# Patient Record
Sex: Male | Born: 1980 | Race: White | Hispanic: No | Marital: Married | Smoking: Former smoker
Health system: Southern US, Community
[De-identification: ages and names within clinical notes are randomized; demographics above are authoritative.]

## PROBLEM LIST (undated history)

## (undated) HISTORY — PX: NO PAST SURGERIES: SHX2092

---

## 2006-05-27 ENCOUNTER — Emergency Department: Payer: Self-pay | Admitting: Emergency Medicine

## 2015-06-20 ENCOUNTER — Ambulatory Visit
Admission: EM | Admit: 2015-06-20 | Discharge: 2015-06-20 | Disposition: A | Discharge status: Home / Self Care | Payer: No Typology Code available for payment source | Attending: Family Medicine | Admitting: Family Medicine

## 2015-06-20 DIAGNOSIS — J020 Streptococcal pharyngitis: Secondary | ICD-10-CM

## 2015-06-20 LAB — RAPID STREP SCREEN (MED CTR MEBANE ONLY): STREPTOCOCCUS, GROUP A SCREEN (DIRECT): POSITIVE — AB

## 2015-06-20 MED ORDER — IBUPROFEN 800 MG PO TABS
800.0000 mg | ORAL_TABLET | Freq: Once | ORAL | Status: AC
  Administered 2015-06-20: 800 mg via ORAL

## 2015-06-20 MED ORDER — AMOXICILLIN 875 MG PO TABS: 875.0000 mg | ORAL_TABLET | Freq: Two times a day (BID) | ORAL | Status: DC

## 2015-06-20 MED ORDER — IBUPROFEN 800 MG PO TABS: 800.0000 mg | ORAL_TABLET | Freq: Three times a day (TID) | ORAL | Status: DC | PRN

## 2015-06-20 MED ORDER — ACETAMINOPHEN 500 MG PO TABS
500.0000 mg | ORAL_TABLET | Freq: Once | ORAL | Status: AC
  Administered 2015-06-20: 1000 mg via ORAL

## 2015-06-20 MED ORDER — DEXAMETHASONE SODIUM PHOSPHATE 10 MG/ML IJ SOLN
10.0000 mg | Freq: Once | INTRAMUSCULAR | Status: AC
  Administered 2015-06-20: 10 mg via INTRAMUSCULAR

## 2015-06-20 NOTE — Discharge Instructions (Signed)
Take medication as prescribed. Rest. Drink plenty of fluids.   Follow up with your primary care physician this week as needed. Return to Urgent care or go to ER for difficulty swallowing, fever not responding to medication, inability to eat or drink, new or worsening concerns.   Strep Throat Strep throat is an infection of the throat caused by a bacteria named Streptococcus pyogenes. Your health care provider may call the infection streptococcal "tonsillitis" or "pharyngitis" depending on whether there are signs of inflammation in the tonsils or back of the throat. Strep throat is most common in children aged 5-15 years during the cold months of the year, but it can occur in people of any age during any season. This infection is spread from person to person (contagious) through coughing, sneezing, or other close contact. SIGNS AND SYMPTOMS   Fever or chills.  Painful, swollen, red tonsils or throat.  Pain or difficulty when swallowing.  White or yellow spots on the tonsils or throat.  Swollen, tender lymph nodes or "glands" of the neck or under the jaw.  Red rash all over the body (rare). DIAGNOSIS  Many different infections can cause the same symptoms. A test must be done to confirm the diagnosis so the right treatment can be given. A "rapid strep test" can help your health care provider make the diagnosis in a few minutes. If this test is not available, a light swab of the infected area can be used for a throat culture test. If a throat culture test is done, results are usually available in a day or two. TREATMENT  Strep throat is treated with antibiotic medicine. HOME CARE INSTRUCTIONS   Gargle with 1 tsp of salt in 1 cup of warm water, 3-4 times per day or as needed for comfort.  Family members who also have a sore throat or fever should be tested for strep throat and treated with antibiotics if they have the strep infection.  Make sure everyone in your household washes their hands  well.  Do not share food, drinking cups, or personal items that could cause the infection to spread to others.  You may need to eat a soft food diet until your sore throat gets better.  Drink enough water and fluids to keep your urine clear or pale yellow. This will help prevent dehydration.  Get plenty of rest.  Stay home from school, day care, or work until you have been on antibiotics for 24 hours.  Take medicines only as directed by your health care provider.  Take your antibiotic medicine as directed by your health care provider. Finish it even if you start to feel better. SEEK MEDICAL CARE IF:   The glands in your neck continue to enlarge.  You develop a rash, cough, or earache.  You cough up green, yellow-brown, or bloody sputum.  You have pain or discomfort not controlled by medicines.  Your problems seem to be getting worse rather than better.  You have a fever. SEEK IMMEDIATE MEDICAL CARE IF:   You develop any new symptoms such as vomiting, severe headache, stiff or painful neck, chest pain, shortness of breath, or trouble swallowing.  You develop severe throat pain, drooling, or changes in your voice.  You develop swelling of the neck, or the skin on the neck becomes red and tender.  You develop signs of dehydration, such as fatigue, dry mouth, and decreased urination.  You become increasingly sleepy, or you cannot wake up completely. MAKE SURE YOU:  Understand  these instructions.  Will watch your condition.  Will get help right away if you are not doing well or get worse. Document Released: 09/27/2000 Document Revised: 02/14/2014 Document Reviewed: 11/29/2010 [facility] Patient Information 2015 [place], [place]. This information is not intended to replace advice given to you by your health care provider. Make sure you discuss any questions you have with your health care provider.

## 2015-06-20 NOTE — ED Provider Notes (Signed)
[facility] Emergency Department Provider Note  ____________________________________________  Time seen: Approximately 3:58 PM  I have reviewed the triage vital signs and the nursing notes.   HISTORY  Chief Complaint Sore Throat; Fever; and Otalgia    HPI Edward Benitez is a 34 y.o. male presents for complaints of sore throat and fever since Sunday night. States intermittent left ear pain as well. States drinking fluids well but decreased appetite. States fever today was 105 orally. States last tylenol was 12 noon today. Denies nausea, vomiting, rash, abdominal pain, chest pain or shortness of breath. Reports continues to drink well. Denies cough or congestion.   States son goes to daycare and was told last week daycare has multiple cases of strep throat. States sore throat pain is 6/10. States hurts to swallow. Denies pain radiation. Denies other pain.    History reviewed. No pertinent past medical history.  There are no active problems to display for this patient.   History reviewed. No pertinent past surgical history.  Current Outpatient Rx  Name  Route  Sig  Dispense  Refill  . acetaminophen (TYLENOL) 325 MG tablet   Oral   Take 650 mg by mouth every 6 (six) hours as needed.           Allergies Review of patient's allergies indicates no known allergies.  History reviewed. No pertinent family history.  Social History Social History  Substance Use Topics  . Smoking status: Former Games developer  . Smokeless tobacco: None  . Alcohol Use: No     Review of Systems Constitutional: positive for fever.  Eyes: No visual changes. ENT: positive for sore throat. Cardiovascular: Denies chest pain. Respiratory: Denies shortness of breath. Gastrointestinal: No abdominal pain.  No nausea, no vomiting.  No diarrhea.  No constipation. Genitourinary: Negative for dysuria. Musculoskeletal: Negative for back pain. Skin: Negative for rash. Neurological:  Negative for headaches, focal weakness or numbness.  10-point ROS otherwise negative.  ____________________________________________   PHYSICAL EXAM:  VITAL SIGNS: ED Triage Vitals  Enc Vitals Group     BP 06/20/15 1521 130/90 mmHg     Pulse Rate 06/20/15 1521 102     Resp 06/20/15 1521 17     Temp 06/20/15 1521 100.4 F (38 C)     Temp Source 06/20/15 1521 Tympanic     SpO2 06/20/15 1521 100 %     Weight 06/20/15 1521 205 lb (92.987 kg)     Height 06/20/15 1521 6\' 1"  (1.854 m)     Head Cir --      Peak Flow --      Pain Score 06/20/15 1524 7     Pain Loc --      Pain Edu? --      Excl. in GC? --    Today's Vitals   06/20/15 1521 06/20/15 1524 06/20/15 1708 06/20/15 1758  BP: 130/90  123/84   Pulse: 102  114 104  Temp: 100.4 F (38 C)  102.1 F (38.9 C) 100.9 F (38.3 C)  TempSrc: Tympanic  Tympanic Tympanic  Resp: 17  17 16   Height: 6\' 1"  (1.854 m)     Weight: 205 lb (92.987 kg)     SpO2: 100%  99% 99%  PainSc:  7  8  9      Constitutional: Alert and oriented. Well appearing and in no acute distress. Eyes: Conjunctivae are normal. PERRL. EOMI. Head: Atraumatic.  Ears: no erythema, normal TMs bilaterally.   Nose: No congestion/rhinnorhea.  Mouth/Throat: Mucous  membranes are moist.  Pharynx mod erythema. Mild bilateral tonsillar swelling, no exudate. No uvular deviation or shift. No abscess visualized. No drooling. Drinking water in room.  Neck: No stridor.  No cervical spine tenderness to palpation. Hematological/Lymphatic/Immunilogical: mild anterior cervical lymphadenopathy. Cardiovascular: Normal rate, regular rhythm. Grossly normal heart sounds.  Good peripheral circulation. Respiratory: Normal respiratory effort.  No retractions. Lungs CTAB. Gastrointestinal: Soft and nontender. No distention. Normal Bowel sounds.  No abdominal bruits. No CVA tenderness. Musculoskeletal: No lower or upper extremity tenderness nor edema.   Neurologic:  Normal speech and  language. No gross focal neurologic deficits are appreciated. No gait instability. Skin:  Skin is warm, dry and intact. No rash noted. Psychiatric: Mood and affect are normal. Speech and behavior are normal.  ____________________________________________   LABS (all labs ordered are listed, but only abnormal results are displayed)  Labs Reviewed  RAPID STREP SCREEN (NOT AT [facility]) - Abnormal; Notable for the following:    Streptococcus, Group A Screen (Direct) POSITIVE (*)    All other components within normal limits   ______________________________________   INITIAL IMPRESSION / ASSESSMENT AND PLAN / ED COURSE  Pertinent labs & imaging results that were available during my care of the patient were reviewed by me and considered in my medical decision making (see chart for details).  Very well appearing. No acute distress. Presents for sore throat and fever x 2 days. Strep positive. Drinking fluids in room. Will treat with oral amoxicillin and ibuprofen. 10 mg decadron IM x one in urgent care.   Monitored in Urgent care as fever was 102.1. Post oral tylenol and ibuprofen, temperature 100.9 and patient reports feeling much better. Patient drinking fluids well in room. No acute distress.   Discussed strict follow up and return parameters with patient. Supportive treatments discussed including eating and drinking as well as fever control. Patient verbalized understanding and agreed to plan.  ____________________________________________   FINAL CLINICAL IMPRESSION(S) / ED DIAGNOSES  Final diagnoses:  Strep throat       Renford Dills, NP 06/20/15 1815

## 2015-06-20 NOTE — ED Notes (Signed)
Woke midnight Sunday/Monday with chills and dizziness. Temp was 100.8. States throat extremely sore when swallowing. Fever was 105. At noon today. Took Tylenol 975 mg at that time. Also c/o general body aches

## 2017-04-11 ENCOUNTER — Encounter: Payer: Self-pay | Admitting: Emergency Medicine

## 2017-04-11 ENCOUNTER — Emergency Department
Admission: EM | Admit: 2017-04-11 | Discharge: 2017-04-11 | Disposition: A | Discharge status: Home / Self Care | Payer: Worker's Compensation | Attending: Emergency Medicine | Admitting: Emergency Medicine

## 2017-04-11 ENCOUNTER — Emergency Department: Payer: Worker's Compensation

## 2017-04-11 DIAGNOSIS — Z23 Encounter for immunization: Secondary | ICD-10-CM | POA: Insufficient documentation

## 2017-04-11 DIAGNOSIS — Y990 Civilian activity done for income or pay: Secondary | ICD-10-CM | POA: Insufficient documentation

## 2017-04-11 DIAGNOSIS — M795 Residual foreign body in soft tissue: Secondary | ICD-10-CM

## 2017-04-11 DIAGNOSIS — Y929 Unspecified place or not applicable: Secondary | ICD-10-CM | POA: Diagnosis not present

## 2017-04-11 DIAGNOSIS — Z87891 Personal history of nicotine dependence: Secondary | ICD-10-CM | POA: Diagnosis not present

## 2017-04-11 DIAGNOSIS — S6992XA Unspecified injury of left wrist, hand and finger(s), initial encounter: Secondary | ICD-10-CM | POA: Diagnosis present

## 2017-04-11 DIAGNOSIS — Y939 Activity, unspecified: Secondary | ICD-10-CM | POA: Diagnosis not present

## 2017-04-11 DIAGNOSIS — X58XXXA Exposure to other specified factors, initial encounter: Secondary | ICD-10-CM | POA: Insufficient documentation

## 2017-04-11 MED ORDER — MORPHINE SULFATE (PF) 4 MG/ML IV SOLN
4.0000 mg | Freq: Once | INTRAVENOUS | Status: AC
  Administered 2017-04-11: 4 mg via INTRAVENOUS
  Filled 2017-04-11: qty 1

## 2017-04-11 MED ORDER — DEXTROSE 5 % IV SOLN
2.0000 mg/kg | Freq: Once | INTRAVENOUS | Status: AC
  Administered 2017-04-11: 190 mg via INTRAVENOUS
  Filled 2017-04-11: qty 4.75

## 2017-04-11 MED ORDER — OXYCODONE-ACETAMINOPHEN 5-325 MG PO TABS
1.0000 | ORAL_TABLET | Freq: Once | ORAL | Status: AC
Start: 2017-04-11 — End: 2017-04-11
  Administered 2017-04-11: 1 via ORAL
  Filled 2017-04-11: qty 1

## 2017-04-11 MED ORDER — OXYCODONE-ACETAMINOPHEN 5-325 MG PO TABS: 1.0000 | ORAL_TABLET | Freq: Four times a day (QID) | ORAL | 0 refills | Status: DC | PRN

## 2017-04-11 MED ORDER — CLINDAMYCIN HCL 150 MG PO CAPS: 450.0000 mg | ORAL_CAPSULE | Freq: Four times a day (QID) | ORAL | 0 refills | Status: AC

## 2017-04-11 MED ORDER — ONDANSETRON HCL 4 MG/2ML IJ SOLN
4.0000 mg | Freq: Once | INTRAMUSCULAR | Status: AC
  Administered 2017-04-11: 4 mg via INTRAVENOUS
  Filled 2017-04-11: qty 2

## 2017-04-11 MED ORDER — LIDOCAINE HCL (PF) 1 % IJ SOLN
10.0000 mL | Freq: Once | INTRAMUSCULAR | Status: AC
  Administered 2017-04-11: 10 mL via INTRADERMAL

## 2017-04-11 MED ORDER — TETANUS-DIPHTH-ACELL PERTUSSIS 5-2.5-18.5 LF-MCG/0.5 IM SUSP
0.5000 mL | Freq: Once | INTRAMUSCULAR | Status: AC
  Administered 2017-04-11: 0.5 mL via INTRAMUSCULAR
  Filled 2017-04-11: qty 0.5

## 2017-04-11 MED ORDER — LIDOCAINE HCL (PF) 1 % IJ SOLN
INTRAMUSCULAR | Status: AC
  Administered 2017-04-11: 10 mL via INTRADERMAL
  Filled 2017-04-11: qty 10

## 2017-04-11 MED ORDER — CLINDAMYCIN PHOSPHATE 600 MG/50ML IV SOLN
600.0000 mg | Freq: Once | INTRAVENOUS | Status: AC
  Administered 2017-04-11: 600 mg via INTRAVENOUS
  Filled 2017-04-11: qty 50

## 2017-04-11 NOTE — ED Triage Notes (Signed)
Pt comes from work with nail through left thumb and pallet board attached to nail. Pt is 10/10 pain and tetanus is not up todate.

## 2017-04-11 NOTE — ED Provider Notes (Signed)
[facility] Emergency Department Provider Note  ____________________________________________   First MD Initiated Contact with Patient 04/11/17 0701     (approximate)  I have reviewed the triage vital signs and the nursing notes.   HISTORY  Chief Complaint Foreign Body in Skin   HPI Edward GulaBrian Benitez is a 36 y.o. male without any chronic medical conditions was presenting after accidentally penetrated into nails through his left hand this morning. He says that he was removing the board when he accidentally impaled his hand on the 2 nails. He says there is no pain now after pain meds and also not moving the hand. He is not sure of his last tetanus shot.   History reviewed. No pertinent past medical history.  There are no active problems to display for this patient.   History reviewed. No pertinent surgical history.  Prior to Admission medications   Medication Sig Start Date End Date Taking? Authorizing Provider  acetaminophen (TYLENOL) 325 MG tablet Take 650 mg by mouth every 6 (six) hours as needed.    [provider]  amoxicillin (AMOXIL) 875 MG tablet Take 1 tablet (875 mg total) by mouth 2 (two) times daily. 06/20/15   Renford DillsMiller, Lindsey, NP  ibuprofen (ADVIL,MOTRIN) 800 MG tablet Take 1 tablet (800 mg total) by mouth every 8 (eight) hours as needed for mild pain or moderate pain. 06/20/15   Renford DillsMiller, Lindsey, NP    Allergies Patient has no known allergies.  No family history on file.  Social History Social History  Substance Use Topics  . Smoking status: Former Games developermoker  . Smokeless tobacco: Not on file  . Alcohol use No    Review of Systems  Constitutional: No fever/chills Eyes: No visual changes. ENT: No sore throat. Cardiovascular: Denies chest pain. Respiratory: Denies shortness of breath. Gastrointestinal: No abdominal pain.  No nausea, no vomiting.  No diarrhea.  No constipation. Genitourinary: Negative for  dysuria. Musculoskeletal: Negative for back pain. Skin: Negative for rash. Neurological: Negative for headaches, focal weakness or numbness.   ____________________________________________   PHYSICAL EXAM:  VITAL SIGNS: ED Triage Vitals [04/11/17 0644]  Enc Vitals Group     BP (!) 132/94     Pulse Rate 93     Resp 18     Temp 97.8 F (36.6 C)     Temp Source Oral     SpO2 97 %     Weight 210 lb (95.3 kg)     Height 6\' 1"  (1.854 m)     Head Circumference      Peak Flow      Pain Score 10     Pain Loc      Pain Edu?      Excl. in GC?     Constitutional: Alert and oriented.  in no acute distress.  Has about a 4 foot board that is wrapped to his left forearm to stabilize it as he has nails that have secured it to his left hand. Eyes: Conjunctivae are normal.  Head: Atraumatic. Nose: No congestion/rhinnorhea. Mouth/Throat: Mucous membranes are moist.  Neck: No stridor.   Cardiovascular: Normal rate, regular rhythm. Grossly normal heart sounds.   Respiratory: Normal respiratory effort.  No retractions. Lungs CTAB. Gastrointestinal: Soft and nontender. No distention.  Musculoskeletal: View of the nail insertion sites are partially obscured by the board overlying the dorsum of the left hand.  However, I'm able to visualize the insertion of the nail into the left dorsum of the thumb just proximal  to the interphalangeal joint. Unable to palpate the course of the nail under the skin to where it exits just abdominal folds. There is no active bleeding at this time. Very mild swelling. Patient is neurovascularly intact with brisk capillary refill to all of his fingers and able to range the fingers minimally except for the thumb which is secured by the nail. Neurologic:  Normal speech and language. No gross focal neurologic deficits are appreciated. Skin:  Skin is warm, dry and intact. No rash noted. Psychiatric: Mood and affect are normal. Speech and behavior are  normal.  ____________________________________________   LABS (all labs ordered are listed, but only abnormal results are displayed)  Labs Reviewed - No data to display ____________________________________________  EKG   ____________________________________________  RADIOLOGY  IMPRESSION: Two nails are noted in the left hand as described above . ____________________________________________   PROCEDURES  Procedure(s) performed:  NERVE BLOCK Performed by: Arelia Longest Consent: Verbal consent obtained. Required items: required blood products, implants, devices, and special equipment available Time out: Immediately prior to procedure a "time out" was called to verify the correct patient, procedure, equipment, support staff and site/side marked as required.  Indication: Foreign body removal  Nerve block body site: Left thumb   Preparation: Patient was prepped in the usual sterile fashion. Needle gauge: 25 G Location technique: anatomical landmarks  Local anesthetic: Lidocaine without epinephrine   Anesthetic total: 3 ml  Outcome: pain improved Patient tolerance: Patient tolerated the procedure well with no immediate complications.  Foreign body removal  After the patient was numbed and given morphine IV the gauze that was wrapped around the patient's forearms to the board was cut free and the board was held stable by assistant. Her then able to raise the board slightly off the forearms dislodge the nail in the hand without issue. Since the dorsal hand now is now very weak pulled gentle traction on the board while I held countertraction on the thumb and the nail was removed without issue. There is a minimal amount of bleeding afterwards and the patient remains neurovascularly intact. He tolerated the procedure well and has aching in his thumb after removal of the nail which she says is decreasing. On initial inspection of the nail it appeared to be dirty. However, after  having the nail fully exposed and it appears to be somewhat aged but without any appreciable amount of rust or dirt.   Procedures  Critical Care performed:   ____________________________________________   INITIAL IMPRESSION / ASSESSMENT AND PLAN / ED COURSE  Pertinent labs & imaging results that were available during my care of the patient were reviewed by me and considered in my medical decision making (see chart for details).  ----------------------------------------- 9:00 AM on 04/11/2017 -----------------------------------------   I discussed the case with Dr. Ernest Pine orthopedics. He agrees with removal of the nail in the emergency Department and recommends the patient follow up at the urgent care tomorrow for a wound check. I was concerned about contamination of the wound and that it may need a washout. The patient will be given gentamicin as well as clindamycin in the emergency department and discharged with clindamycin for home use. We will also be updating his tetanus shot.    ----------------------------------------- 9:54 AM on 04/11/2017 -----------------------------------------  Reassuring repeat x-ray. The patient is still having moderate pain especially when moving the finger. The wound was flushed with sterile saline. He is still neurovascularly intact and we placed in a straight splint and will follow-up with  orthopedics at the [facility] clinic tomorrow. He is understanding of this plan and willing to comply. Furthermore, after extraction of the nail thumb the dorsum of the hand was not penetrated by the nail at all but just abraded.  ____________________________________________   FINAL CLINICAL IMPRESSION(S) / ED DIAGNOSES  Foreign body to the left thumb.    NEW MEDICATIONS STARTED DURING THIS VISIT:  New Prescriptions   No medications on file     Note:  This document was prepared using Dragon voice recognition software and may include unintentional dictation  errors.     Myrna Blazer, MD 04/11/17 [id number]

## 2017-06-18 ENCOUNTER — Ambulatory Visit
Admission: EM | Admit: 2017-06-18 | Discharge: 2017-06-18 | Disposition: A | Discharge status: Home / Self Care | Payer: BLUE CROSS/BLUE SHIELD | Attending: Family Medicine | Admitting: Family Medicine

## 2017-06-18 DIAGNOSIS — H9312 Tinnitus, left ear: Secondary | ICD-10-CM

## 2017-06-18 DIAGNOSIS — H6592 Unspecified nonsuppurative otitis media, left ear: Secondary | ICD-10-CM

## 2017-06-18 DIAGNOSIS — H65192 Other acute nonsuppurative otitis media, left ear: Secondary | ICD-10-CM | POA: Diagnosis not present

## 2017-06-18 MED ORDER — AMOXICILLIN 875 MG PO TABS: 875.0000 mg | ORAL_TABLET | Freq: Two times a day (BID) | ORAL | 0 refills | Status: DC

## 2017-06-18 NOTE — ED Provider Notes (Signed)
[facility] ____________________________________________  Time seen: Approximately 9:08 AM  I have reviewed the triage vital signs and the nursing notes.   HISTORY  Chief Complaint Ear Fullness  HPI Edward Benitez is a 36 y.o. male  presenting for evaluation of left ear muffled sensation and ringing that is been present for the last 3-4 weeks. Patient reports last week he has had accompanying nasal congestion, nasal drainage and some sinus pressure that has overall improved, but still somewhat remains. Denies any fall, head injury, trauma. Denies any drainage or bleeding from ear. Denies any abrupt onset. Denies any facial or ear pain, paresthesias, decreased movement, neck discomfort, headache, chest pain, sore throat, fevers, frequent over-the-counter medication use, recent antibiotic use. Denies history of similar in the past. Denies recent swimming or recent exposure to loud noises. Patient reports otherwise feels well. Denies fevers. Patient states no aggravating or alleviating factors. Reports his wife has called and scheduled ENT appt for next Wednesday. Denies recent antibiotic use. Denies right ear complaints. Denies grinding teeth at night.   History reviewed. No pertinent past medical history. Denies   There are no active problems to display for this patient.   Past Surgical History:  Procedure Laterality Date  . NO PAST SURGERIES       No current facility-administered medications for this encounter.   Current Outpatient Prescriptions:  .  amoxicillin (AMOXIL) 875 MG tablet, Take 1 tablet (875 mg total) by mouth 2 (two) times daily., Disp: 20 tablet, Rfl: 0  Allergies Patient has no known allergies.  No family history on file.  Social History Social History  Substance Use Topics  . Smoking status: Former Games developer  . Smokeless tobacco: Never Used  . Alcohol use No    Review of Systems Constitutional: No fever/chills Eyes: No visual changes. ENT:  No sore throat. Cardiovascular: Denies chest pain. Respiratory: Denies shortness of breath. Gastrointestinal: No abdominal pain.  No nausea, no vomiting.   Genitourinary: Negative for dysuria. Musculoskeletal: Negative for back pain. Skin: Negative for rash. Neurological: Negative for headaches, focal weakness or numbness.   ____________________________________________   PHYSICAL EXAM:  VITAL SIGNS: ED Triage Vitals  Enc Vitals Group     BP 06/18/17 0830 (!) 139/98     Pulse Rate 06/18/17 0830 94     Resp 06/18/17 0830 18     Temp 06/18/17 0830 98.9 F (37.2 C)     Temp Source 06/18/17 0830 Oral     SpO2 06/18/17 0830 100 %     Weight 06/18/17 0827 220 lb (99.8 kg)     Height 06/18/17 0827 6\' 1"  (1.854 m)     Head Circumference --      Peak Flow --      Pain Score 06/18/17 0827 0     Pain Loc --      Pain Edu? --      Excl. in GC? --     Constitutional: Alert and oriented. Well appearing and in no acute distress. Eyes: Conjunctivae are normal. PERRL.  Head: Atraumatic. No sinus tenderness to palpation. No swelling. No erythema.No TMJ tenderness. No rash.  Ears: Right: Nontender, normal canal, no erythema, mild fluid present TM, otherwise normal TM. Left: Nontender, normal canal, mild erythema and fluid present TM, otherwise normal appearing TM. No surrounding tenderness, swelling or erythema bilaterally. No mastoid tenderness. Hearing grossly intact bilaterally.   Nose: Mild nasal congestion.  Mouth/Throat: Mucous membranes are moist. No pharyngeal erythema. No tonsillar swelling or exudate.  Neck:  No stridor.  No cervical spine tenderness to palpation. No carotid bruits noted.  Hematological/Lymphatic/Immunilogical: No cervical lymphadenopathy. Cardiovascular: Normal rate, regular rhythm. Grossly normal heart sounds.  Good peripheral circulation. Respiratory: Normal respiratory effort.  No retractions. No wheezes, rales or rhonchi. Good air movement.  Musculoskeletal:  Ambulatory with steady gait. No cervical, thoracic or lumbar tenderness to palpation. Neurologic:  Normal speech and language. No gait instability. Sensation intact bilateral face. Normal symmetrical facial movements.  Skin:  Skin appears warm, dry and intact. No rash noted. Psychiatric: Mood and affect are normal. Speech and behavior are normal.  ___________________________________________   LABS (all labs ordered are listed, but only abnormal results are displayed)  Labs Reviewed - No data to display ____________________________________________   PROCEDURES Procedures    INITIAL IMPRESSION / ASSESSMENT AND PLAN / ED COURSE  Pertinent labs & imaging results that were available during my care of the patient were reviewed by me and considered in my medical decision making (see chart for details).  Well-appearing patient. Left ear fullness sensation and tinnitus. Denies known trigger. Nose trauma. Patient with recent nasal congestion and effusion noted. Encouraged ENT evaluation, has an appointment this coming week. Will start patient on oral amoxicillin, over-the-counter Claritin. Encouraged rest, fluids, monitoring and follow-up.Discussed indication, risks and benefits of medications with patient. Discussed indication, risks and benefits of medications with patient.  Discussed follow up with Primary care physician this week. Discussed follow up and return parameters including no resolution or any worsening concerns. Patient verbalized understanding and agreed to plan.   ____________________________________________   FINAL CLINICAL IMPRESSION(S) / ED DIAGNOSES  Final diagnoses:  Tinnitus of left ear  Left otitis media with effusion     Discharge Medication List as of 06/18/2017  9:27 AM      Note: This dictation was prepared with Dragon dictation along with smaller phrase technology. Any transcriptional errors that result from this process are unintentional.           Renford DillsMiller, Tula Schryver, NP 06/18/17 1041

## 2017-06-18 NOTE — ED Triage Notes (Signed)
Patient complains of left ear fullness, ringing that started 3.5 weeks ago. Patient states that he has not had any pain.

## 2017-06-18 NOTE — Discharge Instructions (Signed)
Take medication as prescribed. Rest. Drink plenty of fluids.   Follow up with Ear Nose and Throat as scheduled.   Follow up with your primary care physician this week as needed. Return to Urgent care for new or worsening concerns.

## 2017-07-17 ENCOUNTER — Other Ambulatory Visit: Payer: Self-pay | Admitting: Otolaryngology

## 2017-07-17 DIAGNOSIS — H9042 Sensorineural hearing loss, unilateral, left ear, with unrestricted hearing on the contralateral side: Secondary | ICD-10-CM

## 2017-07-17 DIAGNOSIS — IMO0001 Reserved for inherently not codable concepts without codable children: Secondary | ICD-10-CM

## 2017-07-30 ENCOUNTER — Ambulatory Visit
Admission: RE | Admit: 2017-07-30 | Discharge: 2017-07-30 | Disposition: A | Discharge status: Home / Self Care | Payer: BLUE CROSS/BLUE SHIELD | Source: Ambulatory Visit | Attending: Otolaryngology | Admitting: Otolaryngology

## 2017-07-30 DIAGNOSIS — H9042 Sensorineural hearing loss, unilateral, left ear, with unrestricted hearing on the contralateral side: Secondary | ICD-10-CM | POA: Diagnosis not present

## 2017-07-30 DIAGNOSIS — IMO0001 Reserved for inherently not codable concepts without codable children: Secondary | ICD-10-CM

## 2017-07-30 MED ORDER — GADOBENATE DIMEGLUMINE 529 MG/ML IV SOLN
20.0000 mL | Freq: Once | INTRAVENOUS | Status: AC | PRN
  Administered 2017-07-30: 20 mL via INTRAVENOUS

## 2019-11-03 ENCOUNTER — Other Ambulatory Visit: Payer: Self-pay | Admitting: Orthopedic Surgery

## 2019-11-04 ENCOUNTER — Encounter
Admission: RE | Admit: 2019-11-04 | Discharge: 2019-11-04 | Disposition: A | Discharge status: Home / Self Care | Payer: BC Managed Care – PPO | Source: Ambulatory Visit | Attending: Orthopedic Surgery | Admitting: Orthopedic Surgery

## 2019-11-04 ENCOUNTER — Other Ambulatory Visit: Payer: Self-pay

## 2019-11-04 NOTE — Patient Instructions (Signed)
Your procedure is scheduled on: Tuesday November 09, 2019 Report to Day Surgery on the 2nd [facility]. To find out your arrival time, please call [phone] between 1PM - 3PM on: Monday November 08, 2019  REMEMBER: Instructions that are not followed completely may result in serious medical risk, up to and including death; or upon the discretion of your surgeon and anesthesiologist your surgery may need to be rescheduled.  Do not eat food after midnight the night before surgery.  No gum chewing, lozengers or hard candies.  You may however, drink CLEAR liquids up to 2 hours before you are scheduled to arrive for your surgery. Do not drink anything within 2 hours of the start of your surgery.  Clear liquids include: - water  - apple juice without pulp -clear  gatorade - black coffee or tea (Do NOT add milk or creamers to the coffee or tea) Do NOT drink anything that is not on this list.  Type 1 and Type 2 diabetics should only drink water.  ENSURE PRE-SURGERY CARBOHYDRATE DRINK:  Complete drinking 2 hours prior to arrival at hospital.  No Alcohol for 24 hours before or after surgery.  No Smoking including e-cigarettes for 24 hours prior to surgery.  No chewable tobacco products for at least 6 hours prior to surgery.  No nicotine patches on the day of surgery.  On the morning of surgery brush your teeth with toothpaste and water, you may rinse your mouth with mouthwash if you wish. Do not swallow any toothpaste or mouthwash.  Notify your doctor if there is any change in your medical condition (cold, fever, infection).  Do not wear jewelry, make-up, hairpins, clips or nail polish.  Do not wear lotions, powders, or perfumes  Or deodorant  Do not shave 48 hours prior to surgery.   Contacts and dentures may not be worn into surgery.  Do not bring valuables to the hospital, including drivers license, insurance or credit cards.  [facility] is not responsible for any  belongings or valuables.   TAKE THESE MEDICATIONS THE MORNING OF SURGERY: Skelaxin if needed  Use CHG Soap  as directed on instruction sheet.  Follow recommendations from Cardiologist, Pulmonologist or PCP regarding stopping Aspirin, Coumadin, Plavix, Eliquis, Pradaxa, or Pletal.  Stop Anti-inflammatories (NSAIDS) such as Advil, Aleve, Ibuprofen, Motrin, Naproxen, Naprosyn and Aspirin based products such as Excedrin, Goodys Powder, BC Powder. Meloxicam (May take Tylenol or Acetaminophen if needed.)  Stop ANY OVER THE COUNTER supplements until after surgery. (May continue Vitamin D, Vitamin B, and multivitamin.)  Wear comfortable clothing (specific to your surgery type) to the hospital.  Plan for stool softeners for home use.  If you are being discharged the day of surgery, you will not be allowed to drive home. You will need a responsible adult to drive you home and stay with you that night.   If you are taking public transportation, you will need to have a responsible adult with you. Please confirm with your physician that it is acceptable to use public transportation.   Please call [phone] if you have any questions about these instructions.

## 2019-11-05 ENCOUNTER — Other Ambulatory Visit: Payer: BC Managed Care – PPO

## 2019-11-05 ENCOUNTER — Encounter
Admission: RE | Admit: 2019-11-05 | Discharge: 2019-11-05 | Disposition: A | Discharge status: Home / Self Care | Payer: BC Managed Care – PPO | Source: Ambulatory Visit | Attending: Orthopedic Surgery | Admitting: Orthopedic Surgery

## 2019-11-05 ENCOUNTER — Other Ambulatory Visit
Admission: RE | Admit: 2019-11-05 | Discharge: 2019-11-05 | Disposition: A | Discharge status: Home / Self Care | Payer: BC Managed Care – PPO | Source: Ambulatory Visit | Attending: Orthopedic Surgery | Admitting: Orthopedic Surgery

## 2019-11-05 DIAGNOSIS — Z01812 Encounter for preprocedural laboratory examination: Secondary | ICD-10-CM | POA: Diagnosis present

## 2019-11-05 DIAGNOSIS — Z79899 Other long term (current) drug therapy: Secondary | ICD-10-CM | POA: Diagnosis not present

## 2019-11-05 DIAGNOSIS — M879 Osteonecrosis, unspecified: Secondary | ICD-10-CM | POA: Diagnosis present

## 2019-11-05 DIAGNOSIS — Z20822 Contact with and (suspected) exposure to covid-19: Secondary | ICD-10-CM | POA: Diagnosis not present

## 2019-11-05 LAB — CBC WITH DIFFERENTIAL/PLATELET
Abs Immature Granulocytes: 0.12 10*3/uL — ABNORMAL HIGH (ref 0.00–0.07)
Basophils Absolute: 0.1 10*3/uL (ref 0.0–0.1)
Basophils Relative: 1 %
Eosinophils Absolute: 0.2 10*3/uL (ref 0.0–0.5)
Eosinophils Relative: 3 %
HCT: 44 % (ref 39.0–52.0)
Hemoglobin: 15.2 g/dL (ref 13.0–17.0)
Immature Granulocytes: 1 %
Lymphocytes Relative: 37 %
Lymphs Abs: 3.3 10*3/uL (ref 0.7–4.0)
MCH: 30.1 pg (ref 26.0–34.0)
MCHC: 34.5 g/dL (ref 30.0–36.0)
MCV: 87.1 fL (ref 80.0–100.0)
Monocytes Absolute: 0.5 10*3/uL (ref 0.1–1.0)
Monocytes Relative: 6 %
Neutro Abs: 4.6 10*3/uL (ref 1.7–7.7)
Neutrophils Relative %: 52 %
Platelets: 220 10*3/uL (ref 150–400)
RBC: 5.05 MIL/uL (ref 4.22–5.81)
RDW: 13.7 % (ref 11.5–15.5)
WBC: 8.9 10*3/uL (ref 4.0–10.5)
nRBC: 0 % (ref 0.0–0.2)

## 2019-11-05 LAB — URINALYSIS, ROUTINE W REFLEX MICROSCOPIC
Bilirubin Urine: NEGATIVE
Glucose, UA: NEGATIVE mg/dL
Hgb urine dipstick: NEGATIVE
Ketones, ur: NEGATIVE mg/dL
Leukocytes,Ua: NEGATIVE
Nitrite: NEGATIVE
Protein, ur: NEGATIVE mg/dL
Specific Gravity, Urine: 1.012 (ref 1.005–1.030)
pH: 5 (ref 5.0–8.0)

## 2019-11-05 LAB — TYPE AND SCREEN
ABO/RH(D): O POS
Antibody Screen: NEGATIVE

## 2019-11-05 LAB — SURGICAL PCR SCREEN
MRSA, PCR: NEGATIVE
Staphylococcus aureus: NEGATIVE

## 2019-11-05 LAB — COMPREHENSIVE METABOLIC PANEL
ALT: 36 U/L (ref 0–44)
AST: 31 U/L (ref 15–41)
Albumin: 4.5 g/dL (ref 3.5–5.0)
Alkaline Phosphatase: 68 U/L (ref 38–126)
Anion gap: 10 (ref 5–15)
BUN: 17 mg/dL (ref 6–20)
CO2: 24 mmol/L (ref 22–32)
Calcium: 9.2 mg/dL (ref 8.9–10.3)
Chloride: 104 mmol/L (ref 98–111)
Creatinine, Ser: 1.35 mg/dL — ABNORMAL HIGH (ref 0.61–1.24)
GFR calc Af Amer: >60 mL/min (ref >60)
GFR calc non Af Amer: >60 mL/min (ref >60)
Glucose, Bld: 83 mg/dL (ref 70–99)
Potassium: 4.2 mmol/L (ref 3.5–5.1)
Sodium: 138 mmol/L (ref 135–145)
Total Bilirubin: 1 mg/dL (ref 0.3–1.2)
Total Protein: 8.2 g/dL — ABNORMAL HIGH (ref 6.5–8.1)

## 2019-11-05 LAB — APTT: aPTT: 26 seconds (ref 24–36)

## 2019-11-05 LAB — PROTIME-INR
INR: 1 (ref 0.8–1.2)
Prothrombin Time: 12.6 seconds (ref 11.4–15.2)

## 2019-11-05 LAB — SARS CORONAVIRUS 2 (TAT 6-24 HRS): SARS Coronavirus 2: NEGATIVE

## 2019-11-08 MED ORDER — CEFAZOLIN SODIUM-DEXTROSE 2-4 GM/100ML-% IV SOLN
2.0000 g | INTRAVENOUS | Status: AC
  Administered 2019-11-09: 2 g via INTRAVENOUS

## 2019-11-08 MED ORDER — TRANEXAMIC ACID-NACL 1000-0.7 MG/100ML-% IV SOLN
1000.0000 mg | INTRAVENOUS | Status: AC
  Administered 2019-11-09: 1000 mg via INTRAVENOUS

## 2019-11-09 ENCOUNTER — Encounter: Payer: Self-pay | Admitting: Orthopedic Surgery

## 2019-11-09 ENCOUNTER — Ambulatory Visit: Payer: BC Managed Care – PPO | Admitting: Anesthesiology

## 2019-11-09 ENCOUNTER — Ambulatory Visit: Payer: BC Managed Care – PPO

## 2019-11-09 ENCOUNTER — Ambulatory Visit
Admission: RE | Admit: 2019-11-09 | Discharge: 2019-11-09 | Disposition: A | Discharge status: Home / Self Care | Payer: BC Managed Care – PPO | Attending: Orthopedic Surgery | Admitting: Orthopedic Surgery

## 2019-11-09 ENCOUNTER — Other Ambulatory Visit: Payer: Self-pay

## 2019-11-09 ENCOUNTER — Encounter
Admission: RE | Disposition: A | Discharge status: Home / Self Care | Payer: Self-pay | Source: Home / Self Care | Attending: Orthopedic Surgery

## 2019-11-09 DIAGNOSIS — Z419 Encounter for procedure for purposes other than remedying health state, unspecified: Secondary | ICD-10-CM

## 2019-11-09 DIAGNOSIS — M879 Osteonecrosis, unspecified: Secondary | ICD-10-CM | POA: Insufficient documentation

## 2019-11-09 DIAGNOSIS — Z79899 Other long term (current) drug therapy: Secondary | ICD-10-CM | POA: Insufficient documentation

## 2019-11-09 DIAGNOSIS — G8918 Other acute postprocedural pain: Secondary | ICD-10-CM

## 2019-11-09 DIAGNOSIS — Z20822 Contact with and (suspected) exposure to covid-19: Secondary | ICD-10-CM | POA: Insufficient documentation

## 2019-11-09 HISTORY — PX: TOTAL HIP ARTHROPLASTY: SHX124

## 2019-11-09 LAB — CBC
HCT: 41.5 % (ref 39.0–52.0)
Hemoglobin: 14 g/dL (ref 13.0–17.0)
MCH: 30.1 pg (ref 26.0–34.0)
MCHC: 33.7 g/dL (ref 30.0–36.0)
MCV: 89.2 fL (ref 80.0–100.0)
Platelets: 181 10*3/uL (ref 150–400)
RBC: 4.65 MIL/uL (ref 4.22–5.81)
RDW: 13.6 % (ref 11.5–15.5)
WBC: 10.1 10*3/uL (ref 4.0–10.5)
nRBC: 0 % (ref 0.0–0.2)

## 2019-11-09 LAB — CREATININE, SERUM
Creatinine, Ser: 1.26 mg/dL — ABNORMAL HIGH (ref 0.61–1.24)
GFR calc Af Amer: >60 mL/min (ref >60)
GFR calc non Af Amer: >60 mL/min (ref >60)

## 2019-11-09 LAB — ABO/RH: ABO/RH(D): O POS

## 2019-11-09 SURGERY — ARTHROPLASTY, HIP, TOTAL, ANTERIOR APPROACH
Anesthesia: Spinal | Site: Hip | Laterality: Left

## 2019-11-09 MED ORDER — BUPIVACAINE-EPINEPHRINE 0.25% -1:200000 IJ SOLN
INTRAMUSCULAR | Status: DC | PRN
  Administered 2019-11-09: 30 mL

## 2019-11-09 MED ORDER — ENOXAPARIN SODIUM 40 MG/0.4ML ~~LOC~~ SOLN: 40.0000 mg | SUBCUTANEOUS | Status: DC

## 2019-11-09 MED ORDER — BUPIVACAINE HCL (PF) 0.5 % IJ SOLN
INTRAMUSCULAR | Status: DC | PRN
  Administered 2019-11-09: 2.5 mL

## 2019-11-09 MED ORDER — MIDAZOLAM HCL 5 MG/5ML IJ SOLN
INTRAMUSCULAR | Status: DC | PRN
  Administered 2019-11-09: 2 mg via INTRAVENOUS

## 2019-11-09 MED ORDER — METAXALONE 800 MG PO TABS
800.0000 mg | ORAL_TABLET | Freq: Three times a day (TID) | ORAL | Status: DC
  Administered 2019-11-09: 17:00:00 800 mg via ORAL
  Filled 2019-11-09: qty 1

## 2019-11-09 MED ORDER — ACETAMINOPHEN 500 MG PO TABS
ORAL_TABLET | ORAL | Status: AC
  Administered 2019-11-09: 14:00:00 1000 mg via ORAL
  Filled 2019-11-09: qty 2

## 2019-11-09 MED ORDER — SODIUM CHLORIDE 0.9 % IV SOLN
INTRAVENOUS | Status: DC | PRN
  Administered 2019-11-09: 60 mL

## 2019-11-09 MED ORDER — OXYCODONE HCL 5 MG PO TABS: 5.0000 mg | ORAL_TABLET | ORAL | 0 refills | Status: DC | PRN

## 2019-11-09 MED ORDER — FAMOTIDINE 20 MG PO TABS: 20.0000 mg | ORAL_TABLET | Freq: Once | ORAL | Status: AC

## 2019-11-09 MED ORDER — ONDANSETRON HCL 4 MG PO TABS: 4.0000 mg | ORAL_TABLET | Freq: Four times a day (QID) | ORAL | Status: DC | PRN

## 2019-11-09 MED ORDER — TRAMADOL HCL 50 MG PO TABS: 50.0000 mg | ORAL_TABLET | Freq: Four times a day (QID) | ORAL | Status: DC

## 2019-11-09 MED ORDER — OXYCODONE HCL 5 MG PO TABS
10.0000 mg | ORAL_TABLET | ORAL | Status: DC | PRN
  Filled 2019-11-09: qty 3

## 2019-11-09 MED ORDER — PROPOFOL 500 MG/50ML IV EMUL
INTRAVENOUS | Status: AC
  Filled 2019-11-09: qty 50

## 2019-11-09 MED ORDER — CEFAZOLIN SODIUM-DEXTROSE 2-4 GM/100ML-% IV SOLN
2.0000 g | Freq: Four times a day (QID) | INTRAVENOUS | Status: DC
  Administered 2019-11-09: 17:00:00 2 g via INTRAVENOUS

## 2019-11-09 MED ORDER — ACETAMINOPHEN 500 MG PO TABS: 1000.0000 mg | ORAL_TABLET | Freq: Four times a day (QID) | ORAL | Status: DC

## 2019-11-09 MED ORDER — ONDANSETRON HCL 4 MG/2ML IJ SOLN: 4.0000 mg | Freq: Four times a day (QID) | INTRAMUSCULAR | Status: DC | PRN

## 2019-11-09 MED ORDER — FAMOTIDINE 20 MG PO TABS
ORAL_TABLET | ORAL | Status: AC
  Administered 2019-11-09: 20 mg via ORAL
  Filled 2019-11-09: qty 1

## 2019-11-09 MED ORDER — OXYCODONE HCL 5 MG PO TABS
5.0000 mg | ORAL_TABLET | ORAL | Status: DC | PRN
  Filled 2019-11-09: qty 2

## 2019-11-09 MED ORDER — METOCLOPRAMIDE HCL 10 MG PO TABS: 5.0000 mg | ORAL_TABLET | Freq: Three times a day (TID) | ORAL | Status: DC | PRN

## 2019-11-09 MED ORDER — ENOXAPARIN SODIUM 40 MG/0.4ML ~~LOC~~ SOLN: 40.0000 mg | SUBCUTANEOUS | 0 refills | Status: DC

## 2019-11-09 MED ORDER — CEFAZOLIN SODIUM-DEXTROSE 2-4 GM/100ML-% IV SOLN
INTRAVENOUS | Status: AC
  Filled 2019-11-09: qty 100

## 2019-11-09 MED ORDER — OXYCODONE HCL 5 MG/5ML PO SOLN: 5.0000 mg | Freq: Once | ORAL | Status: AC | PRN

## 2019-11-09 MED ORDER — OXYCODONE HCL 5 MG PO TABS
5.0000 mg | ORAL_TABLET | Freq: Once | ORAL | Status: AC | PRN
  Administered 2019-11-09: 18:00:00 5 mg via ORAL

## 2019-11-09 MED ORDER — MENTHOL 3 MG MT LOZG
1.0000 | LOZENGE | OROMUCOSAL | Status: DC | PRN
  Filled 2019-11-09: qty 9

## 2019-11-09 MED ORDER — CHLORHEXIDINE GLUCONATE 4 % EX LIQD: 60.0000 mL | Freq: Once | CUTANEOUS | Status: DC

## 2019-11-09 MED ORDER — MIDAZOLAM HCL 2 MG/2ML IJ SOLN
INTRAMUSCULAR | Status: AC
  Filled 2019-11-09: qty 2

## 2019-11-09 MED ORDER — PHENOL 1.4 % MT LIQD
1.0000 | OROMUCOSAL | Status: DC | PRN
  Filled 2019-11-09: qty 177

## 2019-11-09 MED ORDER — OXYCODONE HCL 5 MG PO TABS
ORAL_TABLET | ORAL | Status: AC
  Filled 2019-11-09: qty 1

## 2019-11-09 MED ORDER — LACTATED RINGERS IV BOLUS
500.0000 mL | Freq: Once | INTRAVENOUS | Status: AC
  Administered 2019-11-09: 15:00:00 500 mL via INTRAVENOUS

## 2019-11-09 MED ORDER — NEOMYCIN-POLYMYXIN B GU 40-200000 IR SOLN
Status: DC | PRN
  Administered 2019-11-09: 2 mL

## 2019-11-09 MED ORDER — FENTANYL CITRATE (PF) 100 MCG/2ML IJ SOLN: 25.0000 ug | INTRAMUSCULAR | Status: DC | PRN

## 2019-11-09 MED ORDER — PROPOFOL 10 MG/ML IV BOLUS
INTRAVENOUS | Status: DC | PRN
  Administered 2019-11-09: 80 mg via INTRAVENOUS

## 2019-11-09 MED ORDER — DOCUSATE SODIUM 100 MG PO CAPS
100.0000 mg | ORAL_CAPSULE | Freq: Two times a day (BID) | ORAL | Status: DC
  Administered 2019-11-09: 17:00:00 100 mg via ORAL
  Filled 2019-11-09: qty 1

## 2019-11-09 MED ORDER — LACTATED RINGERS IV BOLUS
250.0000 mL | Freq: Once | INTRAVENOUS | Status: AC
  Administered 2019-11-09: 14:00:00 250 mL via INTRAVENOUS

## 2019-11-09 MED ORDER — METOCLOPRAMIDE HCL 5 MG/ML IJ SOLN: 5.0000 mg | Freq: Three times a day (TID) | INTRAMUSCULAR | Status: DC | PRN

## 2019-11-09 MED ORDER — ACETAMINOPHEN 325 MG PO TABS: 325.0000 mg | ORAL_TABLET | Freq: Four times a day (QID) | ORAL | Status: DC | PRN

## 2019-11-09 MED ORDER — HYDROMORPHONE HCL 1 MG/ML IJ SOLN: 0.5000 mg | INTRAMUSCULAR | Status: DC | PRN

## 2019-11-09 MED ORDER — TRANEXAMIC ACID-NACL 1000-0.7 MG/100ML-% IV SOLN
INTRAVENOUS | Status: AC
  Filled 2019-11-09: qty 100

## 2019-11-09 MED ORDER — PROPOFOL 500 MG/50ML IV EMUL
INTRAVENOUS | Status: DC | PRN
  Administered 2019-11-09: 150 ug/kg/min via INTRAVENOUS

## 2019-11-09 MED ORDER — TRAMADOL HCL 50 MG PO TABS: 50.0000 mg | ORAL_TABLET | Freq: Four times a day (QID) | ORAL | 1 refills | Status: DC

## 2019-11-09 MED ORDER — LACTATED RINGERS IV SOLN: INTRAVENOUS | Status: DC

## 2019-11-09 MED ORDER — TRAMADOL HCL 50 MG PO TABS
ORAL_TABLET | ORAL | Status: AC
  Administered 2019-11-09: 15:00:00 50 mg via ORAL
  Filled 2019-11-09: qty 1

## 2019-11-09 SURGICAL SUPPLY — 62 items
BLADE SAGITTAL AGGR TOOTH XLG (BLADE) ×3 IMPLANT
BNDG COHESIVE 6X5 TAN STRL LF (GAUZE/BANDAGES/DRESSINGS) ×9 IMPLANT
CANISTER SUCT 1200ML W/VALVE (MISCELLANEOUS) ×3 IMPLANT
CANISTER WOUND CARE 500ML ATS (WOUND CARE) ×3 IMPLANT
CHLORAPREP W/TINT 26 (MISCELLANEOUS) ×3 IMPLANT
COVER BACK TABLE REUSABLE LG (DRAPES) ×3 IMPLANT
COVER WAND RF STERILE (DRAPES) ×3 IMPLANT
DRAPE 3/4 80X56 (DRAPES) ×9 IMPLANT
DRAPE C-ARM XRAY 36X54 (DRAPES) ×3 IMPLANT
DRAPE INCISE IOBAN 66X60 STRL (DRAPES) IMPLANT
DRAPE POUCH INSTRU U-SHP 10X18 (DRAPES) ×3 IMPLANT
DRESSING SURGICEL FIBRLLR 1X2 (HEMOSTASIS) ×2 IMPLANT
DRSG OPSITE POSTOP 4X8 (GAUZE/BANDAGES/DRESSINGS) ×4 IMPLANT
DRSG SURGICEL FIBRILLAR 1X2 (HEMOSTASIS) ×6
ELECT BLADE 6.5 EXT (BLADE) ×3 IMPLANT
ELECT REM PT RETURN 9FT ADLT (ELECTROSURGICAL) ×3
ELECTRODE REM PT RTRN 9FT ADLT (ELECTROSURGICAL) ×1 IMPLANT
GLOVE BIOGEL PI IND STRL 9 (GLOVE) ×1 IMPLANT
GLOVE BIOGEL PI INDICATOR 9 (GLOVE) ×2
GLOVE SURG SYN 9.0  PF PI (GLOVE) ×4
GLOVE SURG SYN 9.0 PF PI (GLOVE) ×2 IMPLANT
GOWN SRG 2XL LVL 4 RGLN SLV (GOWNS) ×1 IMPLANT
GOWN STRL NON-REIN 2XL LVL4 (GOWNS) ×2
GOWN STRL REUS W/ TWL LRG LVL3 (GOWN DISPOSABLE) ×1 IMPLANT
GOWN STRL REUS W/TWL LRG LVL3 (GOWN DISPOSABLE) ×2
HEAD FEMORAL 28MM SZ S (Head) ×2 IMPLANT
HEMOVAC 400CC 10FR (MISCELLANEOUS) IMPLANT
HOLDER FOLEY CATH W/STRAP (MISCELLANEOUS) ×3 IMPLANT
HOOD PEEL AWAY FLYTE STAYCOOL (MISCELLANEOUS) ×3 IMPLANT
KIT PREVENA INCISION MGT 13 (CANNISTER) ×1 IMPLANT
LINER DML 28MM HIGHCROSS (Liner) ×2 IMPLANT
MAT ABSORB  FLUID 56X50 GRAY (MISCELLANEOUS) ×2
MAT ABSORB FLUID 56X50 GRAY (MISCELLANEOUS) ×1 IMPLANT
NDL SAFETY ECLIPSE 18X1.5 (NEEDLE) ×1 IMPLANT
NDL SPNL 20GX3.5 QUINCKE YW (NEEDLE) ×2 IMPLANT
NEEDLE HYPO 18GX1.5 SHARP (NEEDLE) ×2
NEEDLE SPNL 20GX3.5 QUINCKE YW (NEEDLE) ×6 IMPLANT
NS IRRIG 1000ML POUR BTL (IV SOLUTION) ×3 IMPLANT
PACK HIP COMPR (MISCELLANEOUS) ×3 IMPLANT
PENCIL SMOKE EVACUATOR COATED (MISCELLANEOUS) ×3 IMPLANT
SCALPEL PROTECTED #10 DISP (BLADE) ×6 IMPLANT
SHELL ACETABULAR DM  60MM (Shell) ×2 IMPLANT
SOL PREP PVP 2OZ (MISCELLANEOUS) ×3
SOLUTION PREP PVP 2OZ (MISCELLANEOUS) ×1 IMPLANT
SPONGE DRAIN TRACH 4X4 STRL 2S (GAUZE/BANDAGES/DRESSINGS) ×1 IMPLANT
STAPLER SKIN PROX 35W (STAPLE) ×3 IMPLANT
STEM FEMORAL SZ3  STD COLLARED (Stem) ×2 IMPLANT
STRAP SAFETY 5IN WIDE (MISCELLANEOUS) ×3 IMPLANT
SUT DVC 2 QUILL PDO  T11 36X36 (SUTURE) ×2
SUT DVC 2 QUILL PDO T11 36X36 (SUTURE) ×1 IMPLANT
SUT SILK 0 (SUTURE) ×2
SUT SILK 0 30XBRD TIE 6 (SUTURE) ×1 IMPLANT
SUT V-LOC 90 ABS DVC 3-0 CL (SUTURE) ×3 IMPLANT
SUT VIC AB 1 CT1 36 (SUTURE) ×3 IMPLANT
SYR 20ML LL LF (SYRINGE) ×3 IMPLANT
SYR 30ML LL (SYRINGE) ×3 IMPLANT
SYR 50ML LL SCALE MARK (SYRINGE) ×6 IMPLANT
SYR BULB IRRIG 60ML STRL (SYRINGE) ×3 IMPLANT
TAPE MICROFOAM 4IN (TAPE) ×1 IMPLANT
TOWEL OR 17X26 4PK STRL BLUE (TOWEL DISPOSABLE) ×3 IMPLANT
TRAY FOLEY MTR SLVR 16FR STAT (SET/KITS/TRAYS/PACK) ×3 IMPLANT
TUBE KAMVAC SUCTION (TUBING) ×2 IMPLANT

## 2019-11-09 NOTE — Discharge Instructions (Addendum)
Total Hip Replacement, Anterior, Care After This sheet gives you information about how to care for yourself after your procedure. Your health care provider may also give you more specific instructions. If you have problems or questions, contact your health care provider. What can I expect after the procedure? After the procedure, it is common to have:  Pain.  Stiffness.  Discomfort. Follow these instructions at home: Medicines  Take over-the-counter and prescription medicines only as told by your health care provider.  If you were prescribed a blood thinner (anticoagulant) to help prevent blood clots, take it as told by your health care provider. Incision care   Follow instructions from your health care provider about how to take care of your incision. Make sure you: ? Wash your hands with soap and water before you change your bandage (dressing). If soap and water are not available, use hand sanitizer. ? Change your dressing as told by your health care provider. ? Leave stitches (sutures), skin glue, or adhesive strips in place. These skin closures may need to stay in place for 2 weeks or longer. If adhesive strip edges start to loosen and curl up, you may trim the loose edges. Do not remove adhesive strips completely unless your health care provider tells you to do that.  Check your incision area every day for signs of infection. Check for: ? Redness, swelling, or pain. ? Fluid or blood. ? Warmth. ? Pus or a bad smell. Bathing  Do not take baths, swim, or use a hot tub until your health care provider approves. Ask your health care provider if you may take showers. You may only be allowed to take sponge baths.  Keep the dressing dry until your health care provider says it can be removed. Managing pain, stiffness, and swelling   If directed, put ice on the affected area. ? Put ice in a plastic bag. ? Place a towel between your skin and the bag. ? Leave the ice on for 20  minutes, 2-3 times a day.  Move your toes often to avoid stiffness and to lessen swelling.  Raise (elevate) your leg above the level of your heart while you are sitting or lying down. Activity  Rest as told by your health care provider.  Avoid sitting for a long time without moving. Get up to take short walks every 1-2 hours. This is important to improve blood flow and breathing. Ask for help if you feel weak or unsteady.  Do exercises as told by your health care provider or physical therapist.  Follow instructions from your health care provider about using a Bricco, crutches, or a cane. ? You may use your legs to support (bear) your body weight as told by your health care provider. Follow instructions about how much weight you may safely support on your affected leg (weight-bearing restrictions). ? A physical therapist may show you how to get out of a bed and chair and how to go up and down stairs. You will first do this with a Axelrod, crutches, or a cane and then without any of these devices. ? Once you are able to walk without a limp, you may stop using a Morris, crutches, or cane.  Return to your normal activities as told by your health care provider. Ask your health care provider what activities are safe for you. Safety  To help prevent falls, keep floors clear of objects you may trip over, and place items that you may need within easy reach.  Wear  an apron or tool belt with pockets for carrying objects. This leaves your hands free to help with your balance. Driving  Do not drive or use heavy machinery while taking prescription pain medicine.  Ask your health care provider when it is safe to drive. General instructions  Wear compression stockings as told by your health care provider. These stockings help to prevent blood clots and reduce swelling in your legs.  Continue with breathing exercises as directed by your health care provider. This helps prevent lung infection.  If  you are taking prescription pain medicine, take actions to prevent or treat constipation. Your health care provider may recommend that you: ? Drink enough fluid to keep your urine pale yellow. ? Eat foods that are high in fiber, such as fresh fruits and vegetables, whole grains, and beans. ? Limit foods that are high in fat and processed sugars, such as fried or sweet foods. ? Take an over-the-counter or prescription medicine for constipation.  Do not use any products that contain nicotine or tobacco, such as cigarettes and e-cigarettes. These can delay bone healing. If you need help quitting, ask your health care provider.  Tell your health care provider if you plan to have dental work. Also: ? Tell your dentist about your joint replacement. ? Ask your health care provider if there are any special instructions you need to follow before having dental care and routine cleanings.  Keep all follow-up visits as told by your health care provider. This is important. Contact a health care provider if:  You have a fever or chills.  You have a cough or feel short of breath.  Your medicine is not controlling your pain.  You have redness, swelling, or pain around your incision.  You have fluid or blood coming from your incision.  Your incision feels warm to the touch.  You have pus or a bad smell coming from your incision. Get help right away if you have:  Severe pain.  Trouble breathing.  Chest pain.  Redness, swelling, pain, and warmth in your calf or leg. Summary  Follow instructions from your health care provider about how to take care of your incision.  Do not take baths, swim, or use a hot tub until your health care provider approves.  Use crutches, a walker, or a cane as told by your health care provider.  If you were prescribed a blood thinner (anticoagulant) to help prevent blood clots, take it as told by your health care provider. This information is not intended to  replace advice given to you by your health care provider. Make sure you discuss any questions you have with your health care provider. Document Revised: 02/08/2019 Document Reviewed: 01/14/2018 Elsevier Patient Education  2020 [employer]   1) The drugs that you were given will stay in your system until tomorrow so for the next 24 hours you should not:  A) Drive an automobile B) Make any legal decisions C) Drink any alcoholic beverage   2) You may resume regular meals tomorrow.  Today it is better to start with liquids and gradually work up to solid foods.  You may eat anything you prefer, but it is better to start with liquids, then soup and crackers, and gradually work up to solid foods.   3) Please notify your doctor immediately if you have any unusual bleeding, trouble breathing, redness and pain at the surgery site, drainage, fever, or pain not relieved by medication.  4) Additional Instructions: TAKE A STOOL SOFTENER TWICE A DAY WHILE TAKING NARCOTIC PAIN MEDICINE TO PREVENT CONSTIPATION   Please contact your physician with any problems or Same Day Surgery at [phone], Monday through Friday 6 am to 4 pm, or [place] at [facility] number at [phone].

## 2019-11-09 NOTE — Op Note (Signed)
11/09/2019  1:14 PM  PATIENT:  Edward Benitez  39 y.o. male  PRE-OPERATIVE DIAGNOSIS:  AVASCULAR NECROSIS OF LEFT HIP  POST-OPERATIVE DIAGNOSIS:  AVASCULAR NECROSIS OF LEFT HIP  PROCEDURE:  Procedure(s): LEFT TOTAL HIP ARTHROPLASTY ANTERIOR APPROACH (Left)  SURGEON: Leitha Schuller, MD  ASSISTANTS: None  ANESTHESIA:   spinal  EBL:  Total I/O In: -  Out: 400 [Urine:200; Blood:200]  BLOOD ADMINISTERED:none  DRAINS: none   LOCAL MEDICATIONS USED:  MARCAINE    and OTHER Exparel  SPECIMEN:  Source of Specimen:  Left femoral head  DISPOSITION OF SPECIMEN:  PATHOLOGY  COUNTS:  YES  TOURNIQUET:  * No tourniquets in log *  IMPLANTS: Medacta AMIS standard size 3 with 60 mm Mpact TM cup and liner with ceramic S 28 mm head  DICTATION: .Dragon Dictation   The patient was brought to the operating room and after spinal anesthesia was obtained patient was placed on the operative table with the ipsilateral foot into the Medacta attachment, contralateral leg on a well-padded table. C-arm was brought in and preop template x-ray taken. After prepping and draping in usual sterile fashion appropriate patient identification and timeout procedures were completed. Anterior approach to the hip was obtained and centered over the greater trochanter and TFL muscle. The subcutaneous tissue was incised hemostasis being achieved by electrocautery. TFL fascia was incised and the muscle retracted laterally deep retractor placed. The lateral femoral circumflex vessels were identified and ligated. The anterior capsule was exposed and a capsulotomy performed. The neck was identified and a femoral neck cut carried out with a saw. The head was removed without difficulty and showed collapse of the femoral head with breakage of the articular cartilage extensive synovitis and synovitis in the acetabular notch as well. Reaming was carried out to 60 mm and a 60 mm cup trial gave appropriate tightness to the acetabular  component a 60 mm DM cup was impacted into position. The leg was then externally rotated and ischiofemoral and pubofemoral releases carried out. The femur was sequentially broached to a size 3 standard, size 3 standard with S head trials were placed and the final components chosen. The 3 standard stem was inserted along with a ceramic S 28 mm head and 60 mm liner. The hip was reduced and was stable the wound was thoroughly irrigated with fibrillar placed along the posterior capsule and medial neck. The deep fascia ws closed using a heavy Quill after infiltration of 30 cc of quarter percent Sensorcaine with epinephrine, with Exparel in addition to the Sensorcaine this was injected throughout the case for postop analgesia .3-0 V-loc to close the skin with skin staples.  Honeycomb dressing applied patient was sent to recovery in stable condition.   PLAN OF CARE: Discharge to home after PACU

## 2019-11-09 NOTE — Transfer of Care (Signed)
Immediate Anesthesia Transfer of Care Note  Patient: Edward Benitez  Procedure(s) Performed: LEFT TOTAL HIP ARTHROPLASTY ANTERIOR APPROACH (Left Hip)  Patient Location: PACU  Anesthesia Type:Spinal  Level of Consciousness: sedated  Airway & Oxygen Therapy: Patient Spontanous Breathing and Patient connected to face mask oxygen  Post-op Assessment: Report given to RN and Post -op Vital signs reviewed and stable  Post vital signs: Reviewed and stable  Last Vitals:  Vitals Value Taken Time  BP 104/45 11/09/19 1313  Temp    Pulse 64 11/09/19 1315  Resp 15 11/09/19 1315  SpO2 100 % 11/09/19 1315  Vitals shown include unvalidated device data.  Last Pain:  Vitals:   11/09/19 1037  TempSrc: Temporal  PainSc: 8       Patients Stated Pain Goal: 3 (11/09/19 1037)  Complications: No apparent anesthesia complications

## 2019-11-09 NOTE — OR Nursing (Signed)
PT in for eval 1640.

## 2019-11-09 NOTE — Anesthesia Preprocedure Evaluation (Signed)
Anesthesia Evaluation  Patient identified by MRN, date of birth, ID band Patient awake    Reviewed: Allergy & Precautions, H&P , NPO status , Patient's Chart, lab work & pertinent test results  History of Anesthesia Complications Negative for: history of anesthetic complications  Airway Mallampati: III  TM Distance: <3 FB Neck ROM: full    Dental  (+) Chipped   Pulmonary neg shortness of breath, former smoker,  Signs and symptoms suggestive of sleep apnea            Cardiovascular (-) angina(-) Past MI and (-) DOE negative cardio ROS       Neuro/Psych negative neurological ROS  negative psych ROS   GI/Hepatic negative GI ROS, Neg liver ROS, neg GERD  ,  Endo/Other  negative endocrine ROS  Renal/GU      Musculoskeletal   Abdominal   Peds  Hematology negative hematology ROS (+)   Anesthesia Other Findings  Past Surgical History: No date: NO PAST SURGERIES     Reproductive/Obstetrics negative OB ROS                             Anesthesia Physical Anesthesia Plan  ASA: II  Anesthesia Plan: Spinal   Post-op Pain Management:    Induction:   PONV Risk Score and Plan:   Airway Management Planned: Natural Airway and Nasal Cannula  Additional Equipment:   Intra-op Plan:   Post-operative Plan:   Informed Consent: I have reviewed the patients History and Physical, chart, labs and discussed the procedure including the risks, benefits and alternatives for the proposed anesthesia with the patient or authorized representative who has indicated his/her understanding and acceptance.     Dental Advisory Given  Plan Discussed with: Anesthesiologist, CRNA and Surgeon  Anesthesia Plan Comments: (Patient reports no bleeding problems and no anticoagulant use.  Plan for spinal with backup GA  Patient consented for risks of anesthesia including but not limited to:  - adverse  reactions to medications - risk of bleeding, infection, nerve damage and headache - risk of failed spinal - damage to teeth, lips or other oral mucosa - sore throat or hoarseness - Damage to heart, brain, lungs or loss of life  Patient voiced understanding.)        Anesthesia Quick Evaluation

## 2019-11-09 NOTE — Evaluation (Signed)
Physical Therapy Evaluation Patient Details Name: Edward Benitez MRN: [id number] DOB: 12/05/1980 Today's Date: 11/09/2019   History of Present Illness  39 y/o male with L hip avascular necrosis, s/p L total hip (anterior approach) replacement.  Clinical Impression  Pt with slow recovery of sensation in L LE, but does have active ROM and good quad control/strength with testing.  However, in getting to standing he had buckling in the R hip (Trendelenberg sign-like) but he was able to actively correct for this with deliberate muscle engagement and using walker was able to walk >100 ft and negotiate up/down steps.  Pt did well with exercises, showed good understanding of precautions and will be able to return home safely with assist from family. Pt did have an issue with nausea after doing steps with drop in HR, vitals quickly stable, nursing with pt at end of session.     Follow Up Recommendations Home health PT;Follow surgeon's recommendation for DC plan and follow-up therapies    Equipment Recommendations  None recommended by PT(had father-in-laws walker)    Recommendations for Other Services       Precautions / Restrictions Precautions Precautions: Anterior Hip Precaution Booklet Issued: Yes (comment) Restrictions Weight Bearing Restrictions: Yes LLE Weight Bearing: Weight bearing as tolerated      Mobility  Bed Mobility Overal bed mobility: Independent             General bed mobility comments: able to transition to sitting w/o assist, minimal cuing  Transfers Overall transfer level: Modified independent Equipment used: Rolling walker (2 wheeled)             General transfer comment: Pt struggled much more than expected with L hip/knee near buckling in getting to standing, very reliant on UEs  Ambulation/Gait Ambulation/Gait assistance: Min guard Gait Distance (Feet): 125 Feet Assistive device: Rolling walker (2 wheeled)       General Gait Details: Pt with  multiple bouts of L hip Abd buckling and initially needed to be very slow and extremely deliberate with each steps and initially was not overly safe.  He did improve with increased time and focus.  by the end he was able to maintain slow but continuous walker forward motion with no L LE buckling  Stairs Stairs: Yes Stairs assistance: Min guard Stair Management: Two rails Number of Stairs: 4 General stair comments: With light cuing and heavy UE reliance pt was able to ascend/descend steps  Wheelchair Mobility    Modified Rankin (Stroke Patients Only)       Balance Overall balance assessment: Needs assistance Sitting-balance support: No upper extremity supported Sitting balance-Leahy Scale: Good     Standing balance support: Bilateral upper extremity supported Standing balance-Leahy Scale: Fair Standing balance comment: Pt with multiple bouts of buckling in L hip, highly reliant on walker but good UE strength allowed him to arrest each time                             Pertinent Vitals/Pain Pain Assessment: 0-10 Pain Score: 4  Pain Location: ant hip    Home Living Family/patient expects to be discharged to:: Private residence Living Arrangements: Spouse/significant other;Children Available Help at Discharge: Family(wife home all day tomorrow, 40 y/o child will be around ) Type of Home: House Home Access: Stairs to enter Entrance Stairs-Rails: Can reach both Entrance Stairs-Number of Steps: 2 Home Layout: One level Home Equipment: Walker - 2 wheels;Crutches  Prior Function Level of Independence: Independent         Comments: Independent and active, works as Psychologist, counselling Extremity Assessment Upper Extremity Assessment: Overall WFL for tasks assessed    Lower Extremity Assessment Lower Extremity Assessment: (expected L LE post-op weakness)       Communication   Communication: No  difficulties  Cognition Arousal/Alertness: Awake/alert Behavior During Therapy: WFL for tasks assessed/performed Overall Cognitive Status: Within Functional Limits for tasks assessed                                        General Comments      Exercises Total Joint Exercises Ankle Circles/Pumps: Strengthening;10 reps Quad Sets: Strengthening;10 reps Short Arc Quad: Strengthening;10 reps Heel Slides: Strengthening;10 reps Hip ABduction/ADduction: Strengthening;10 reps Straight Leg Raises: AROM;5 reps   Assessment/Plan    PT Assessment Patient needs continued PT services  PT Problem List Decreased strength;Decreased range of motion;Decreased activity tolerance;Decreased balance;Decreased mobility;Decreased knowledge of use of DME;Decreased safety awareness;Pain;Decreased knowledge of precautions       PT Treatment Interventions DME instruction;Gait training;Stair training;Functional mobility training;Therapeutic exercise;Balance training;Therapeutic activities;Patient/family education    PT Goals (Current goals can be found in the Care Plan section)  Acute Rehab PT Goals Patient Stated Goal: go hme PT Goal Formulation: With patient Time For Goal Achievement: 11/23/19 Potential to Achieve Goals: Good    Frequency BID   Barriers to discharge        Co-evaluation               AM-PAC PT "6 Clicks" Mobility  Outcome Measure Help needed turning from your back to your side while in a flat bed without using bedrails?: None Help needed moving from lying on your back to sitting on the side of a flat bed without using bedrails?: None Help needed moving to and from a bed to a chair (including a wheelchair)?: None Help needed standing up from a chair using your arms (e.g., wheelchair or bedside chair)?: None Help needed to walk in hospital room?: A Little Help needed climbing 3-5 steps with a railing? : A Little 6 Click Score: 22    End of Session  Equipment Utilized During Treatment: Gait belt Activity Tolerance: Patient tolerated treatment well Patient left: in chair;with nursing/sitter in room Nurse Communication: Mobility status PT Visit Diagnosis: Muscle weakness (generalized) (M62.81);Difficulty in walking, not elsewhere classified (R26.2);Pain Pain - Right/Left: Left Pain - part of body: Hip    Time: 3546-5681 PT Time Calculation (min) (ACUTE ONLY): 42 min   Charges:   PT Evaluation $PT Eval Low Complexity: 1 Low PT Treatments $Gait Training: 8-22 mins $Therapeutic Exercise: 8-22 mins        Malachi Pro, DPT 11/09/2019, 6:37 PM

## 2019-11-09 NOTE — Anesthesia Procedure Notes (Signed)
Date/Time: 11/09/2019 12:10 PM Performed by: Junious Silk, CRNA Pre-anesthesia Checklist: Patient identified, Emergency Drugs available, Suction available, Patient being monitored and Timeout performed Oxygen Delivery Method: Simple face mask

## 2019-11-09 NOTE — Anesthesia Postprocedure Evaluation (Signed)
Anesthesia Post Note  Patient: Edward Benitez  Procedure(s) Performed: LEFT TOTAL HIP ARTHROPLASTY ANTERIOR APPROACH (Left Hip)  Patient location during evaluation: PACU Anesthesia Type: Spinal Level of consciousness: oriented and awake and alert Pain management: pain level controlled Vital Signs Assessment: post-procedure vital signs reviewed and stable Respiratory status: spontaneous breathing Cardiovascular status: blood pressure returned to baseline and stable Postop Assessment: no headache, no backache, no apparent nausea or vomiting and patient able to bend at knees Anesthetic complications: no     Last Vitals:  Vitals:   11/09/19 1445 11/09/19 1500  BP: 122/83 127/82  Pulse: (!) 44 (!) 50  Resp: 13 15  Temp:  (!) 36.2 C  SpO2: 100% 100%    Last Pain:  Vitals:   11/09/19 1415  TempSrc:   PainSc: 0-No pain                 Cleda Mccreedy Sabatino Williard

## 2019-11-09 NOTE — H&P (Signed)
Reviewed paper H+P, will be scanned into chart. No changes noted.  

## 2019-11-09 NOTE — Anesthesia Procedure Notes (Signed)
Spinal  Patient location during procedure: OR Start time: 11/09/2019 11:40 AM End time: 11/09/2019 11:46 AM Staffing Performed: resident/CRNA  Resident/CRNA: Nelda Marseille, CRNA Preanesthetic Checklist Completed: patient identified, IV checked, site marked, risks and benefits discussed, surgical consent, monitors and equipment checked, pre-op evaluation and timeout performed Spinal Block Patient position: sitting Prep: Betadine Patient monitoring: heart rate, continuous pulse ox, blood pressure and cardiac monitor Approach: midline Location: L3-4 Injection technique: single-shot Needle Needle type: Whitacre and Introducer  Needle gauge: 25 G Needle length: 9 cm Assessment Sensory level: T10 Additional Notes Negative paresthesia. Negative blood return. Positive free-flowing CSF. Expiration date of kit checked and confirmed. Patient tolerated procedure well, without complications.

## 2019-11-10 LAB — SURGICAL PATHOLOGY

## 2020-12-07 IMAGING — DX DG HIP (WITH OR WITHOUT PELVIS) 2-3V*L*
2 series · 2 of 2 positions shown · non-contrast
Comparison: Portable exam 3446 hours compared intraoperative images
of same date

CLINICAL DATA: Post anterior hip replacement

EXAM:
DG HIP (WITH OR WITHOUT PELVIS) 2-3V LEFT

[hip ap]
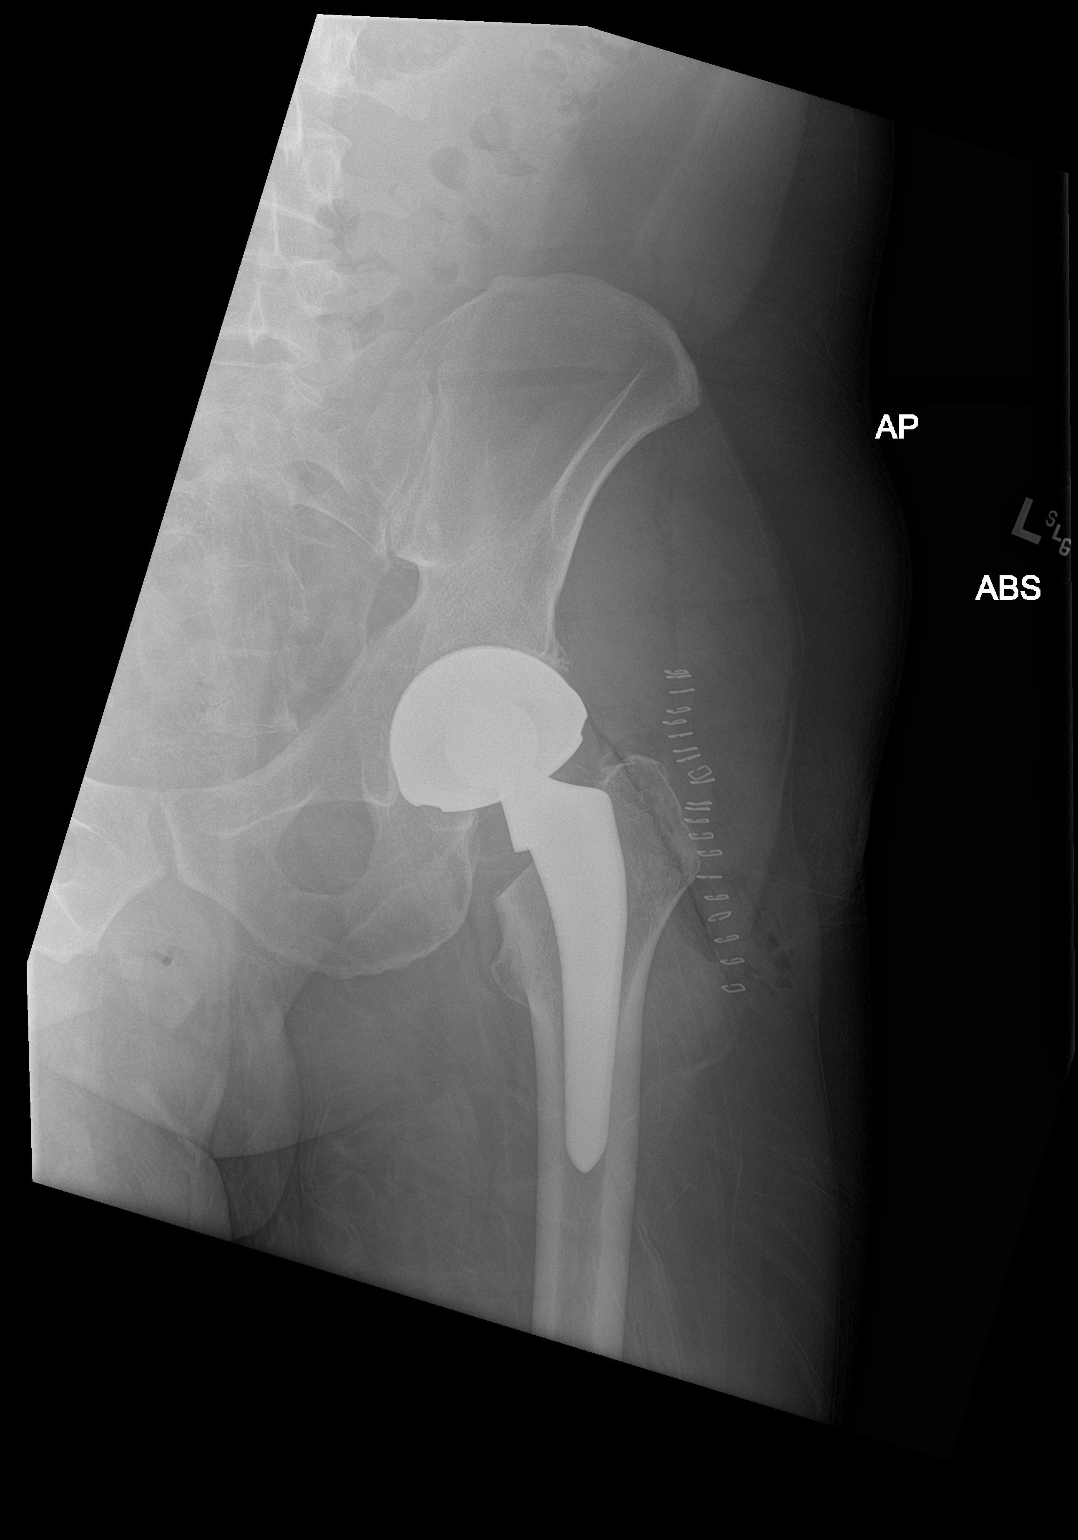

[hip lat]
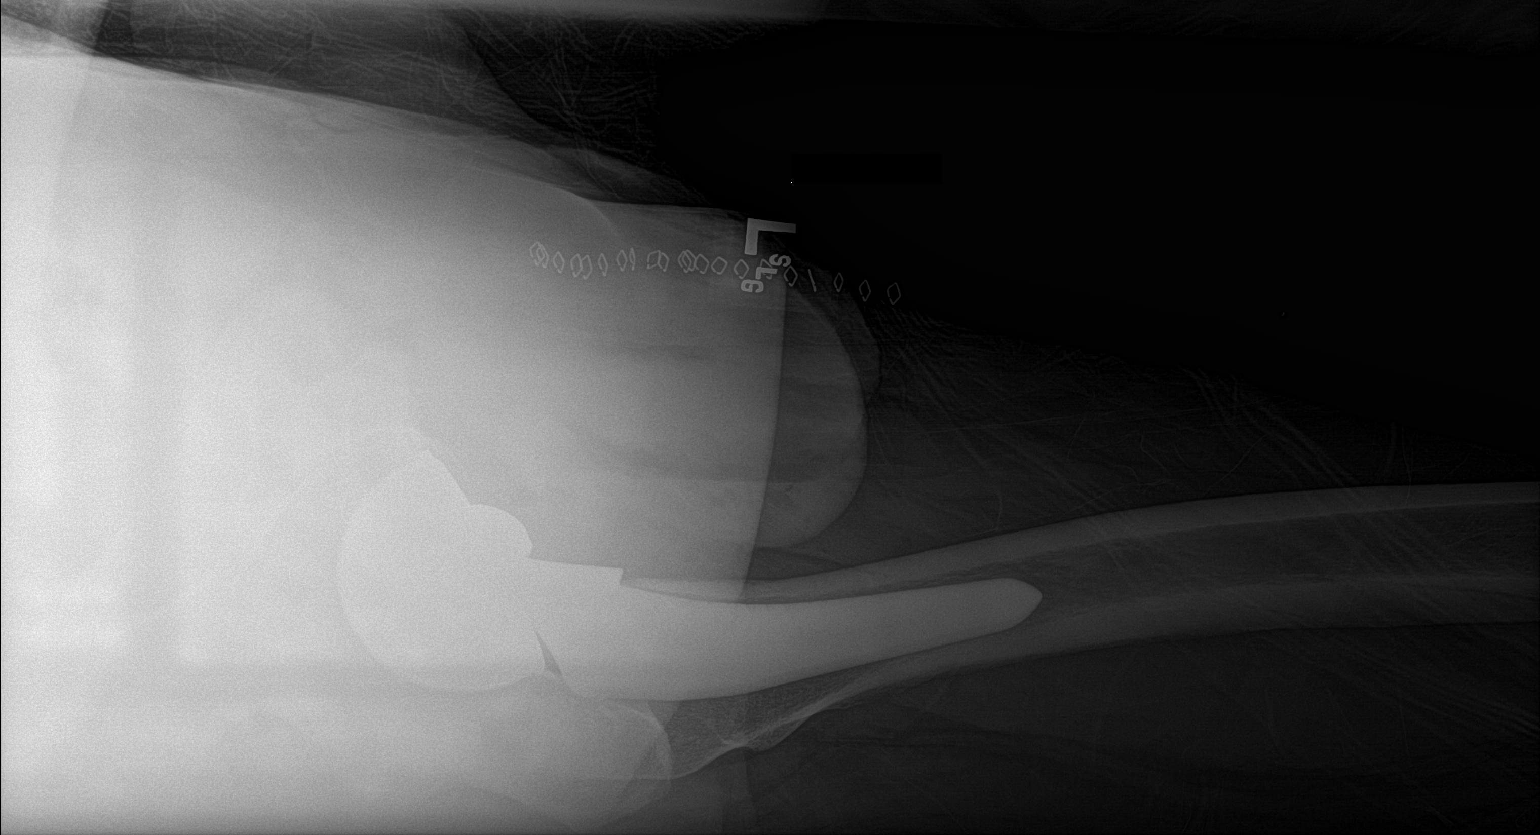

[2 of 2 positions shown; findings below may reference images not displayed]

FINDINGS: LEFT hip prosthesis in expected position.

No fracture, dislocation, or bone destruction.

Overlying skin clips and postsurgical changes of soft tissues.
IMPRESSION: LEFT hip replacement without acute complication.

## 2020-12-07 IMAGING — XA DG HIP (WITH PELVIS) OPERATIVE*L*
1 series · 1 of 1 positions shown · non-contrast
Comparison: None

FLUOROSCOPY TIME:  0 minutes 18 seconds

CLINICAL DATA: LEFT hip replacement

EXAM:
OPERATIVE LEFT HIP (WITH PELVIS IF PERFORMED) 2 VIEWS
TECHNIQUE: Fluoroscopic spot image(s) were submitted for interpretation
post-operatively.

[Series 2: ortho standard · 1 of 1 slices shown]
[im 1/1]
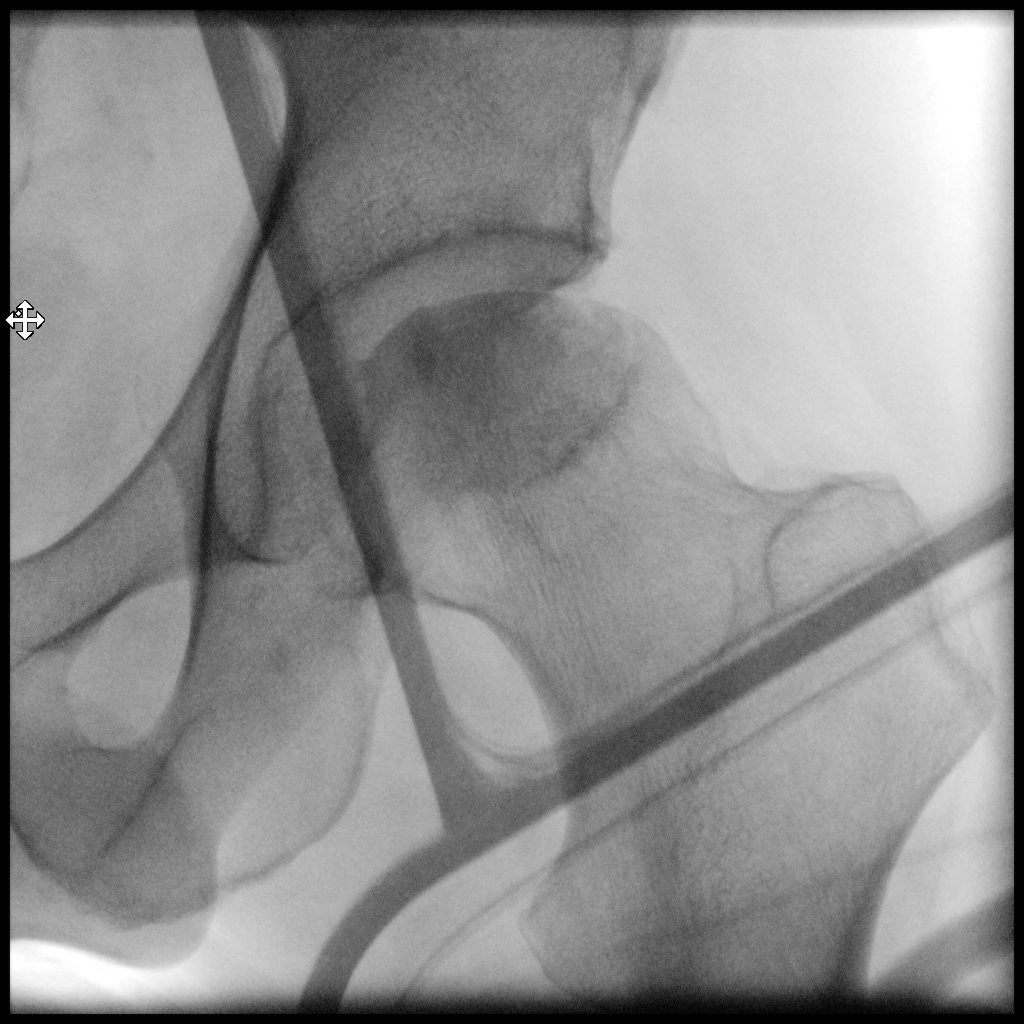

[1 of 1 positions shown; findings below may reference images not displayed]

FINDINGS: First image demonstrates a vascular necrosis changes of the LEFT
femoral head with mild flattening.

Second image demonstrates components of a LEFT hip prosthesis
without fracture or dislocation.

Distal aspect of the femoral component not imaged.
IMPRESSION: LEFT hip replacement without acute abnormalities.

## 2023-01-01 ENCOUNTER — Telehealth: Payer: Self-pay | Admitting: *Deleted

## 2023-01-01 NOTE — Telephone Encounter (Signed)
Called pt to advise we can not do a vasectomy without him being seen by the provider first. I will change the appointment to VAS consult.  Pt scheduled appt thru [facility]  .left message to have patient return my call.

## 2023-01-01 NOTE — Telephone Encounter (Signed)
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 Patient voiced understanding.

## 2023-01-15 ENCOUNTER — Ambulatory Visit (INDEPENDENT_AMBULATORY_CARE_PROVIDER_SITE_OTHER): Payer: BC Managed Care – PPO | Admitting: Urology

## 2023-01-15 ENCOUNTER — Encounter: Payer: Self-pay | Admitting: Urology

## 2023-01-15 VITALS — BP 148/95 | HR 54 | Ht 72.5 in | Wt 227.0 lb

## 2023-01-15 DIAGNOSIS — Z3009 Encounter for other general counseling and advice on contraception: Secondary | ICD-10-CM | POA: Diagnosis not present

## 2023-01-15 MED ORDER — DIAZEPAM 5 MG PO TABS: 5.0000 mg | ORAL_TABLET | Freq: Once | ORAL | 0 refills | Status: DC | PRN

## 2023-01-15 NOTE — Progress Notes (Signed)
   01/15/23 2:47 PM   Edward Benitez Mar 25, 1981 [id number]  CC: Discuss vasectomy  HPI: 42 year old male with 4 children interested in vasectomy for permanent sterilization.  He and his wife do not desire any further biologic pregnancies.  He denies any urinary symptoms.  No family history of prostate cancer.    Surgical History: Past Surgical History:  Procedure Laterality Date   NO PAST SURGERIES     TOTAL HIP ARTHROPLASTY Left 11/09/2019   Procedure: LEFT TOTAL HIP ARTHROPLASTY ANTERIOR APPROACH;  Surgeon: Hessie Knows, MD;  Location: [facility] ORS;  Service: Orthopedics;  Laterality: Left;     Social History:  reports that he has quit smoking. He has been exposed to tobacco smoke. He has quit using smokeless tobacco.  His smokeless tobacco use included chew. He reports current alcohol use of about 6.0 standard drinks of alcohol per week. He reports that he does not use drugs.  Physical Exam: BP (!) 148/95   Pulse (!) 54   Ht 6' 0.5" (1.842 m)   Wt 227 lb (103 kg)   BMI 30.36 kg/m    Constitutional:  Alert and oriented, No acute distress. Cardiovascular: No clubbing, cyanosis, or edema. Respiratory: Normal respiratory effort, no increased work of breathing. GI: Abdomen is soft, nontender, nondistended, no abdominal masses GU: Circumcised phallus with patent meatus, no lesions, testicles 20 cc and descended bilaterally, vas deferens easily palpable bilaterally   Assessment & Plan:   42 year old male with 4 children interested in vasectomy for permanent sterilization.  We discussed the risks and benefits of vasectomy at length.  Vasectomy is intended to be a permanent form of contraception, and does not produce immediate sterility.  Following vasectomy another form of contraception is required until vas occlusion is confirmed by a post-vasectomy semen analysis obtained 2-3 months after the procedure.  Even after vas occlusion is confirmed, vasectomy is not 100% reliable in  preventing pregnancy, and the failure rate is approximately 10/1998.  Repeat vasectomy is required in less than 1% of patients.  He should refrain from ejaculation for 1 week after vasectomy.  Options for fertility after vasectomy include vasectomy reversal, and sperm retrieval with in vitro fertilization or ICSI.  These options are not always successful and may be expensive.  Finally, there are other permanent and non-permanent alternatives to vasectomy available. There is no risk of erectile dysfunction, and the volume of semen will be similar to prior, as the majority of the ejaculate is from the prostate and seminal vesicles.   The procedure takes ~20 minutes.  We recommend patients take 5-10 mg of Valium 30 minutes prior, and he will need a driver post-procedure.  Local anesthetic is injected into the scrotal skin and a small segment of the vas deferens is removed, and the ends occluded. The complication rate is approximately 1-2%, and includes bleeding, infection, and development of chronic scrotal pain.  PLAN: Schedule vasectomy Valium sent to pharmacy  Nickolas Madrid, MD 01/15/2023  [facility] [address] [place], [place] [address] [phone]

## 2023-01-15 NOTE — Patient Instructions (Signed)

## 2023-02-20 ENCOUNTER — Encounter: Payer: Self-pay | Admitting: Urology

## 2023-02-20 ENCOUNTER — Ambulatory Visit (INDEPENDENT_AMBULATORY_CARE_PROVIDER_SITE_OTHER): Payer: BC Managed Care – PPO | Admitting: Urology

## 2023-02-20 VITALS — BP 172/84 | HR 64 | Ht 72.0 in | Wt 227.0 lb

## 2023-02-20 DIAGNOSIS — Z302 Encounter for sterilization: Secondary | ICD-10-CM | POA: Diagnosis not present

## 2023-02-20 DIAGNOSIS — Z3009 Encounter for other general counseling and advice on contraception: Secondary | ICD-10-CM

## 2023-02-20 NOTE — Progress Notes (Signed)
VASECTOMY PROCEDURE NOTE:  The patient was taken to the minor procedure room and placed in the supine position. His genitals were prepped and draped in the usual sterile fashion. The right vas deferens was brought up to the skin of the right upper scrotum. The skin overlying it was anesthetized with 1% lidocaine without epinephrine, anesthetic was also injected alongside the vas deferens in the direction of the inguinal canal. The no scalpel vasectomy instrument was used to make a small perforation in the scrotal skin. The vasectomy clamp was used to grasp the vas deferens. It was carefully dissected free from surrounding structures. A 1cm segment of the vas was removed, and the cut ends of the mucosa were cauterized. A figure of eight suture was used to perform fascial interposition. No significant bleeding was noted. The vas deferens was returned to the scrotum. The skin incision was closed with a simple interrupted stitch of 4-0 chromic.  Attention was then turned to the left side. The left vasectomy was performed in the same exact fashion. Sterile dressings were placed over each incision. The patient tolerated the procedure well.  IMPRESSION/DIAGNOSIS: The patient is a 42 year old gentleman who underwent a vasectomy today. Post-procedure instructions were reviewed. I stressed the importance of continuing to use birth control until he provides a semen specimen more than 2 months from now that demonstrates azoospermia.  We discussed return precautions including fever over 101, significant bleeding or hematoma, or uncontrolled pain. I also stressed the importance of avoiding strenuous activity for one week, no sexual activity or ejaculations for 5 days, intermittent icing over the next 48 hours, and scrotal support.   PLAN: The patient will be advised of his semen analysis results when available.  Legrand Rams, MD 02/20/2023

## 2023-02-20 NOTE — Patient Instructions (Signed)

## 2023-02-20 NOTE — Addendum Note (Signed)
Addended by: Sueanne Margarita on: 02/20/2023 12:07 PM   Modules accepted: Orders

## 2023-05-23 ENCOUNTER — Other Ambulatory Visit: Payer: BC Managed Care – PPO

## 2023-05-23 DIAGNOSIS — Z302 Encounter for sterilization: Secondary | ICD-10-CM
